# Patient Record
Sex: Male | Born: 1990 | Race: White | Hispanic: No | Marital: Single | Smoking: Never smoker
Health system: Southern US, Community
[De-identification: ages and names within clinical notes are randomized; demographics above are authoritative.]

## PROBLEM LIST (undated history)

## (undated) DIAGNOSIS — S82899A Other fracture of unspecified lower leg, initial encounter for closed fracture: Secondary | ICD-10-CM

---
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
## 2013-06-11 ENCOUNTER — Emergency Department (HOSPITAL_COMMUNITY): Payer: No Typology Code available for payment source

## 2013-06-11 ENCOUNTER — Emergency Department (HOSPITAL_COMMUNITY)
Admission: EM | Admit: 2013-06-11 | Discharge: 2013-06-11 | Disposition: A | Discharge status: Home / Self Care | Payer: No Typology Code available for payment source | Attending: Emergency Medicine | Admitting: Emergency Medicine

## 2013-06-11 ENCOUNTER — Encounter (HOSPITAL_COMMUNITY): Payer: Self-pay

## 2013-06-11 DIAGNOSIS — Z23 Encounter for immunization: Secondary | ICD-10-CM | POA: Insufficient documentation

## 2013-06-11 DIAGNOSIS — Y9389 Activity, other specified: Secondary | ICD-10-CM | POA: Insufficient documentation

## 2013-06-11 DIAGNOSIS — Y92410 Unspecified street and highway as the place of occurrence of the external cause: Secondary | ICD-10-CM | POA: Insufficient documentation

## 2013-06-11 DIAGNOSIS — S40029A Contusion of unspecified upper arm, initial encounter: Secondary | ICD-10-CM | POA: Insufficient documentation

## 2013-06-11 DIAGNOSIS — Z8679 Personal history of other diseases of the circulatory system: Secondary | ICD-10-CM | POA: Insufficient documentation

## 2013-06-11 DIAGNOSIS — R51 Headache: Secondary | ICD-10-CM | POA: Insufficient documentation

## 2013-06-11 DIAGNOSIS — S0101XA Laceration without foreign body of scalp, initial encounter: Secondary | ICD-10-CM

## 2013-06-11 DIAGNOSIS — S0100XA Unspecified open wound of scalp, initial encounter: Secondary | ICD-10-CM | POA: Insufficient documentation

## 2013-06-11 DIAGNOSIS — S40022A Contusion of left upper arm, initial encounter: Secondary | ICD-10-CM

## 2013-06-11 HISTORY — DX: Other fracture of unspecified lower leg, initial encounter for closed fracture: S82.899A

## 2013-06-11 MED ORDER — LIDOCAINE-EPINEPHRINE 1 %-1:100000 IJ SOLN
10.0000 mL | Freq: Once | INTRAMUSCULAR | Status: AC
  Administered 2013-06-11: 10 mL via INTRADERMAL
  Filled 2013-06-11: qty 1

## 2013-06-11 MED ORDER — TETANUS-DIPHTH-ACELL PERTUSSIS 5-2.5-18.5 LF-MCG/0.5 IM SUSP
0.5000 mL | Freq: Once | INTRAMUSCULAR | Status: AC
  Administered 2013-06-11: 0.5 mL via INTRAMUSCULAR
  Filled 2013-06-11: qty 0.5
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
## 2013-06-11 NOTE — ED Notes (Signed)
Mother at the bedside. 
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
## 2013-06-11 NOTE — ED Notes (Signed)
Dr. Miller at the bedside.  

## 2013-06-11 NOTE — ED Notes (Signed)
Dr. Lew Dawes at the bedside.
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
## 2013-06-11 NOTE — ED Notes (Signed)
Pt transported to CT ?
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
## 2013-06-15 ENCOUNTER — Encounter (HOSPITAL_COMMUNITY): Payer: Self-pay | Admitting: Emergency Medicine

## 2013-06-15 ENCOUNTER — Emergency Department (HOSPITAL_COMMUNITY)
Admission: EM | Admit: 2013-06-15 | Discharge: 2013-06-15 | Disposition: A | Discharge status: Home / Self Care | Payer: No Typology Code available for payment source | Attending: Emergency Medicine | Admitting: Emergency Medicine

## 2013-06-15 DIAGNOSIS — Z8781 Personal history of (healed) traumatic fracture: Secondary | ICD-10-CM | POA: Insufficient documentation

## 2013-06-15 DIAGNOSIS — Z4802 Encounter for removal of sutures: Secondary | ICD-10-CM

## 2013-06-15 MED ORDER — VALACYCLOVIR HCL 1 G PO TABS: 1000.0000 mg | ORAL_TABLET | Freq: Two times a day (BID) | ORAL | Status: AC

## 2013-06-15 NOTE — ED Notes (Signed)
Discharge instructions reviewed with pt. Pt verbalized understanding.   

## 2013-06-15 NOTE — ED Notes (Signed)
Pt c/o staple removal from head that were placed on Monday
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
## 2013-06-15 NOTE — ED Notes (Signed)
Pt here to follow up and get suture removal.

## 2013-06-15 NOTE — ED Provider Notes (Signed)
Medical screening examination/treatment/procedure(s) were performed by non-physician practitioner and as supervising physician I was immediately available for consultation/collaboration.   Shanna Cisco, MD 06/15/13 [phone]

## 2013-11-30 ENCOUNTER — Emergency Department (HOSPITAL_COMMUNITY)
Admission: EM | Admit: 2013-11-30 | Discharge: 2013-11-30 | Disposition: A | Discharge status: Home / Self Care | Payer: BC Managed Care – PPO | Source: Home / Self Care

## 2013-11-30 ENCOUNTER — Encounter (HOSPITAL_COMMUNITY): Payer: Self-pay | Admitting: Emergency Medicine

## 2013-11-30 DIAGNOSIS — B009 Herpesviral infection, unspecified: Secondary | ICD-10-CM

## 2013-11-30 DIAGNOSIS — B001 Herpesviral vesicular dermatitis: Secondary | ICD-10-CM

## 2013-11-30 MED ORDER — ACYCLOVIR 400 MG PO TABS: 400.0000 mg | ORAL_TABLET | Freq: Four times a day (QID) | ORAL | Status: AC

## 2013-11-30 NOTE — ED Provider Notes (Signed)
CSN: [id number]     Arrival date & time 11/30/13  [id number] History   First MD Initiated Contact with Patient 11/30/13 [phone]     Chief Complaint  Patient presents with  . Mouth Lesions     (Consider location/radiation/quality/duration/timing/severity/associated sxs/prior Treatment) HPI Comments: 23 year old male is complaining of recurrent herpes simplex lesions to his lower lip. He has been having these for 2 years. The last one occurred this morning.   Past Medical History  Diagnosis Date  . Ankle fracture     right ankle   History reviewed. No pertinent past surgical history. No family history on file. History  Substance Use Topics  . Smoking status: Never Smoker   . Smokeless tobacco: Not on file  . Alcohol Use: Yes     Comment: socially    Review of Systems  Constitutional: Negative.   All other systems reviewed and are negative.      Allergies  Review of patient's allergies indicates no known allergies.  Home Medications   Current Outpatient Rx  Name  Route  Sig  Dispense  Refill  . acyclovir (ZOVIRAX) 400 MG tablet   Oral   Take 1 tablet (400 mg total) by mouth 4 (four) times daily.   30 tablet   0   . ibuprofen (ADVIL,MOTRIN) 200 MG tablet   Oral   Take 200 mg by mouth every 6 (six) hours as needed for pain.          BP 117/83  Pulse 76  Temp(Src) 98.5 F (36.9 C) (Oral)  Resp 18  SpO2 100% Physical Exam  Nursing note and vitals reviewed. Constitutional: He is oriented to person, place, and time. He appears well-developed and well-nourished. No distress.  HENT:  Mouth/Throat: Oropharynx is clear and moist. No oropharyngeal exudate.  Small, 4 mm slightly red, nearly flat erythematous lesion to the lower lip. He reports it is not painful at this time.   Eyes: Conjunctivae and EOM are normal.  Neck: Normal range of motion. Neck supple.  Neurological: He is alert and oriented to person, place, and time.  Skin: Skin is warm and dry.    ED Course   Procedures (including critical care time) Labs Review Labs Reviewed - No data to display Imaging Review No results found.    MDM   Final diagnoses:  Herpes simplex  Recurrent cold sores   Acyclovir 400 qid x 7 d Follow with your doctor    Hayden RasmussenDavid Evin Chirco, NP 11/30/13 [phone]

## 2013-11-30 NOTE — Discharge Instructions (Signed)
Cold Sore  A cold sore (fever blister) is a skin infection caused by the herpes simplex virus (HSV-1). HSV-1 is closely related to the virus that causes gential herpes (HSV-2), but they are not the same even though both viruses can cause oral and genital infections. Cold sores are small, fluid-filled sores inside of the mouth or on the lips, gums, nose, chin, cheeks, or fingers.   The herpes simplex virus can be easily passed (contagious) to other people through close personal contact, such as kissing or sharing personal items. The virus can also spread to other parts of the body, such as the eyes or genitals. Cold sores are contagious until the sores crust over completely. They often heal within 2 weeks.   Once a person is infected, the herpes simplex virus remains permanently in the body. Therefore, there is no cure for cold sores, and they often recur when a person is tired, stressed, sick, or gets too much sun. Additional factors that can cause a recurrence include hormone changes in menstruation or pregnancy, certain drugs, and cold weather.   CAUSES   Cold sores are caused by the herpes simplex virus. The virus is spread from person to person through close contact, such as through kissing, touching the affected area, or sharing personal items such as lip balm, razors, or eating utensils.   SYMPTOMS   The first infection may not cause symptoms. If symptoms develop, the symptoms often go through different stages. Here is how a cold sore develops:   · Tingling, itching, or burning is felt 1 2 days before the outbreak.    · Fluid-filled blisters appear on the lips, inside the mouth, nose, or on the cheeks.    · The blisters start to ooze clear fluid.    · The blisters dry up and a yellow crust appears in its place.    · The crust falls off.    Symptoms depend on whether it is the initial outbreak or a recurrence. Some other symptoms with the first outbreak may include:   · Fever.    · Sore throat.    · Headache.     · Muscle aches.    · Swollen neck glands.    DIAGNOSIS   A diagnosis is often made based on your symptoms and looking at the sores. Sometimes, a sore may be swabbed and then examined in the lab to make a final diagnosis. If the sores are not present, blood tests can find the herpes simplex virus.   TREATMENT   There is no cure for cold sores and no vaccine for the herpes simplex virus. Within 2 weeks, most cold sores go away on their own without treatment. Medicines cannot make the infection go away, but medicine can help relieve some of the pain associated with the sores, can work to stop the virus from multiplying, and can also shorten healing time. Medicine may be in the form of creams, gels, pills, or a shot.   HOME CARE INSTRUCTIONS   · Only take over-the-counter or prescription medicines for pain, discomfort, or fever as directed by your caregiver. Do not use aspirin.    · Use a cotton-tip swab to apply creams or gels to your sores.    · Do not touch the sores or pick the scabs. Wash your hands often. Do not touch your eyes without washing your hands first.    · Avoid kissing, oral sex, and sharing personal items until sores heal.    · Apply an ice pack on your sores for 10 15 minutes to ease any   to drink if you have pain when drinking out of a glass.   Keep sores clean and dry to prevent an infection of other tissues.   Avoid the sun and limit stress if these things trigger outbreaks. If sun causes cold sores, apply sunscreen on the lips before being out in the sun.  SEEK MEDICAL CARE IF:   You have a fever or persistent symptoms for more than 2 3 days.   You have a fever and your symptoms suddenly get worse.   You have pus, not clear fluid, coming from the sores.   You have redness that is spreading.   You have pain or irritation in your  eye.   You get sores on your genitals.   Your sores do not heal within 2 weeks.   You have a weakened immune system.   You have frequent recurrences of cold sores.  MAKE SURE YOU:   Understand these instructions.  Will watch your condition.  Will get help right away if you are not doing well or get worse. Document Released: 10/01/2000 Document Revised: 06/28/2012 Document Reviewed: 02/16/2012 [facility] Patient Information 2014 [place], [place].  Herpes Simplex Herpes simplex is generally classified as Type 1 or Type 2. Type 1 is generally the type that is responsible for cold sores. Type 2 is generally associated with sexually transmitted diseases. We now know that most of the thoughts on these viruses are inaccurate. We find that HSV1 is also present genitally and HSV2 can be present orally, but this will vary in different locations of the world. Herpes simplex is usually detected by doing a culture. Blood tests are also available for this virus; however, the accuracy is often not as good.  PREPARATION FOR TEST No preparation or fasting is necessary. NORMAL FINDINGS  No virus present  No HSV antigens or antibodies present Ranges for normal findings may vary among different laboratories and hospitals. You should always check with your doctor after having lab work or other tests done to discuss the meaning of your test results and whether your values are considered within normal limits. MEANING OF TEST  Your caregiver will go over the test results with you and discuss the importance and meaning of your results, as well as treatment options and the need for additional tests if necessary. OBTAINING THE TEST RESULTS  It is your responsibility to obtain your test results. Ask the lab or department performing the test when and how you will get your results. Document Released: 11/06/2004 Document Revised: 12/27/2011 Document Reviewed: 09/14/2008 [facility] Patient Information 2014  [place], [place].

## 2013-11-30 NOTE — ED Provider Notes (Signed)
Medical screening examination/treatment/procedure(s) were performed by resident physician or non-physician practitioner and as supervising physician I was immediately available for consultation/collaboration.   KINDL,JAMES DOUGLAS MD.   James D Kindl, MD 11/30/13 1311 

## 2013-11-30 NOTE — ED Notes (Signed)
Pt c/o cold sores onset 1 week on lower lip Reports his getting over a cold Denies f/v/n/d Alert w/no signs of acute distress.
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
